# Patient Record
Sex: Male | Born: 1980
Health system: Southern US, Community
[De-identification: ages and names within clinical notes are randomized; demographics above are authoritative.]

## PROBLEM LIST (undated history)

## (undated) DIAGNOSIS — J302 Other seasonal allergic rhinitis: Secondary | ICD-10-CM

---

## 2008-03-17 ENCOUNTER — Emergency Department (HOSPITAL_COMMUNITY): Admission: EM | Admit: 2008-03-17 | Discharge: 2008-03-17 | Payer: Self-pay | Admitting: Emergency Medicine

## 2009-01-07 ENCOUNTER — Emergency Department (HOSPITAL_COMMUNITY): Admission: EM | Admit: 2009-01-07 | Discharge: 2009-01-07 | Payer: Self-pay | Admitting: Emergency Medicine

## 2010-04-21 ENCOUNTER — Emergency Department (HOSPITAL_BASED_OUTPATIENT_CLINIC_OR_DEPARTMENT_OTHER): Admission: EM | Admit: 2010-04-21 | Discharge: 2010-04-21 | Payer: Self-pay | Admitting: Emergency Medicine
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
## 2011-05-05 LAB — DIFFERENTIAL
Basophils Absolute: 0
Lymphocytes Relative: 40
Monocytes Absolute: 0.6
Neutro Abs: 3.2

## 2011-05-05 LAB — CBC
HCT: 41.5
MCHC: 34.4
MCV: 82
Platelets: 303
RDW: 15.1

## 2011-05-05 LAB — COMPREHENSIVE METABOLIC PANEL
Albumin: 4
BUN: 10
Calcium: 9.3
Chloride: 108
Creatinine, Ser: 1.2
Total Bilirubin: 1
Total Protein: 7.4

## 2011-11-23 ENCOUNTER — Encounter (HOSPITAL_BASED_OUTPATIENT_CLINIC_OR_DEPARTMENT_OTHER): Payer: Self-pay | Admitting: *Deleted

## 2011-11-23 ENCOUNTER — Emergency Department (HOSPITAL_BASED_OUTPATIENT_CLINIC_OR_DEPARTMENT_OTHER)
Admission: EM | Admit: 2011-11-23 | Discharge: 2011-11-23 | Disposition: A | Discharge status: Home / Self Care | Payer: Self-pay | Attending: Emergency Medicine | Admitting: Emergency Medicine

## 2011-11-23 DIAGNOSIS — L0291 Cutaneous abscess, unspecified: Secondary | ICD-10-CM

## 2011-11-23 DIAGNOSIS — I10 Essential (primary) hypertension: Secondary | ICD-10-CM | POA: Insufficient documentation

## 2011-11-23 DIAGNOSIS — L02219 Cutaneous abscess of trunk, unspecified: Secondary | ICD-10-CM | POA: Insufficient documentation

## 2011-11-23 DIAGNOSIS — F17200 Nicotine dependence, unspecified, uncomplicated: Secondary | ICD-10-CM | POA: Insufficient documentation

## 2011-11-23 HISTORY — DX: Other seasonal allergic rhinitis: J30.2

## 2011-11-23 MED ORDER — SULFAMETHOXAZOLE-TRIMETHOPRIM 800-160 MG PO TABS: 1.0000 | ORAL_TABLET | Freq: Two times a day (BID) | ORAL | Status: AC

## 2011-11-23 MED ORDER — OXYCODONE-ACETAMINOPHEN 5-325 MG PO TABS: 1.0000 | ORAL_TABLET | ORAL | Status: AC | PRN

## 2011-11-23 NOTE — ED Notes (Signed)
C/o right leg abscess near the groin. First noticed it Sunday. Has been using warm compresses without relief. ?fever with body aches.

## 2011-11-23 NOTE — Discharge Instructions (Signed)
Abscess An abscess (boil or furuncle) is an infected area that contains a collection of pus.  SYMPTOMS Signs and symptoms of an abscess include pain, tenderness, redness, or hardness. You may feel a moveable soft area under your skin. An abscess can occur anywhere in the body.  TREATMENT  A surgical cut (incision) may be made over your abscess to drain the pus. Gauze may be packed into the space or a drain may be looped through the abscess cavity (pocket). This provides a drain that will allow the cavity to heal from the inside outwards. The abscess may be painful for a few days, but should feel much better if it was drained.  Your abscess, if seen early, may not have localized and may not have been drained. If not, another appointment may be required if it does not get better on its own or with medications. HOME CARE INSTRUCTIONS   Only take over-the-counter or prescription medicines for pain, discomfort, or fever as directed by your caregiver.   Take your antibiotics as directed if they were prescribed. Finish them even if you start to feel better.   Keep the skin and clothes clean around your abscess.   If the abscess was drained, you will need to use gauze dressing to collect any draining pus. Dressings will typically need to be changed 3 or more times a day.   The infection may spread by skin contact with others. Avoid skin contact as much as possible.   Practice good hygiene. This includes regular hand washing, cover any draining skin lesions, and do not share personal care items.   If you participate in sports, do not share athletic equipment, towels, whirlpools, or personal care items. Shower after every practice or tournament.   If a draining area cannot be adequately covered:   Do not participate in sports.   Children should not participate in day care until the wound has healed or drainage stops.   If your caregiver has given you a follow-up appointment, it is very important  to keep that appointment. Not keeping the appointment could result in a much worse infection, chronic or permanent injury, pain, and disability. If there is any problem keeping the appointment, you must call back to this facility for assistance.  SEEK MEDICAL CARE IF:   You develop increased pain, swelling, redness, drainage, or bleeding in the wound site.   You develop signs of generalized infection including muscle aches, chills, fever, or a general ill feeling.   You have an oral temperature above 102 F (38.9 C).  MAKE SURE YOU:   Understand these instructions.   Will watch your condition.   Will get help right away if you are not doing well or get worse.  Document Released: 05/03/2005 Document Revised: 07/13/2011 Document Reviewed: 02/25/2008 [facility] Patient Information 2012 [place], [place].Arterial Hypertension Arterial hypertension (high blood pressure) is a condition of elevated pressure in your blood vessels. Hypertension over a long period of time is a risk factor for strokes, heart attacks, and heart failure. It is also the leading cause of kidney (renal) failure.  CAUSES   In Adults -- Over 90% of all hypertension has no known cause. This is called essential or primary hypertension. In the other 10% of people with hypertension, the increase in blood pressure is caused by another disorder. This is called secondary hypertension. Important causes of secondary hypertension are:   Heavy alcohol use.   Obstructive sleep apnea.   Hyperaldosterosim (Conn's syndrome).   Steroid use.  Chronic kidney failure.   Hyperparathyroidism.   Medications.   Renal artery stenosis.   Pheochromocytoma.   Cushing's disease.   Coarctation of the aorta.   Scleroderma renal crisis.   Licorice (in excessive amounts).   Drugs (cocaine, methamphetamine).  Your caregiver can explain any items above that apply to you.  In Children -- Secondary hypertension is more common and should  always be considered.   Pregnancy -- Few women of childbearing age have high blood pressure. However, up to 10% of them develop hypertension of pregnancy. Generally, this will not harm the woman. It may be a sign of 3 complications of pregnancy: preeclampsia, HELLP syndrome, and eclampsia. Follow up and control with medication is necessary.  SYMPTOMS   This condition normally does not produce any noticeable symptoms. It is usually found during a routine exam.   Malignant hypertension is a late problem of high blood pressure. It may have the following symptoms:   Headaches.   Blurred vision.   End-organ damage (this means your kidneys, heart, lungs, and other organs are being damaged).   Stressful situations can increase the blood pressure. If a person with normal blood pressure has their blood pressure go up while being seen by their caregiver, this is often termed "white coat hypertension." Its importance is not known. It may be related with eventually developing hypertension or complications of hypertension.   Hypertension is often confused with mental tension, stress, and anxiety.  DIAGNOSIS  The diagnosis is made by 3 separate blood pressure measurements. They are taken at least 1 week apart from each other. If there is organ damage from hypertension, the diagnosis may be made without repeat measurements. Hypertension is usually identified by having blood pressure readings:  Above 140/90 mmHg measured in both arms, at 3 separate times, over a couple weeks.   Over 130/80 mmHg should be considered a risk factor and may require treatment in patients with diabetes.  Blood pressure readings over 120/80 mmHg are called "pre-hypertension" even in non-diabetic patients. To get a true blood pressure measurement, use the following guidelines. Be aware of the factors that can alter blood pressure readings.  Take measurements at least 1 hour after caffeine.   Take measurements 30 minutes after  smoking and without any stress. This is another reason to quit smoking - it raises your blood pressure.   Use a proper cuff size. Ask your caregiver if you are not sure about your cuff size.   Most home blood pressure cuffs are automatic. They will measure systolic and diastolic pressures. The systolic pressure is the pressure reading at the start of sounds. Diastolic pressure is the pressure at which the sounds disappear. If you are elderly, measure pressures in multiple postures. Try sitting, lying or standing.   Sit at rest for a minimum of 5 minutes before taking measurements.   You should not be on any medications like decongestants. These are found in many cold medications.   Record your blood pressure readings and review them with your caregiver.  If you have hypertension:  Your caregiver may do tests to be sure you do not have secondary hypertension (see "causes" above).   Your caregiver may also look for signs of metabolic syndrome. This is also called Syndrome X or Insulin Resistance Syndrome. You may have this syndrome if you have type 2 diabetes, abdominal obesity, and abnormal blood lipids in addition to hypertension.   Your caregiver will take your medical and family history and perform a physical  exam.   Diagnostic tests may include blood tests (for glucose, cholesterol, potassium, and kidney function), a urinalysis, or an EKG. Other tests may also be necessary depending on your condition.  PREVENTION  There are important lifestyle issues that you can adopt to reduce your chance of developing hypertension:  Maintain a normal weight.   Limit the amount of salt (sodium) in your diet.   Exercise often.   Limit alcohol intake.   Get enough potassium in your diet. Discuss specific advice with your caregiver.   Follow a DASH diet (dietary approaches to stop hypertension). This diet is rich in fruits, vegetables, and low-fat dairy products, and avoids certain fats.    PROGNOSIS  Essential hypertension cannot be cured. Lifestyle changes and medical treatment can lower blood pressure and reduce complications. The prognosis of secondary hypertension depends on the underlying cause. Many people whose hypertension is controlled with medicine or lifestyle changes can live a normal, healthy life.  RISKS AND COMPLICATIONS  While high blood pressure alone is not an illness, it often requires treatment due to its short- and long-term effects on many organs. Hypertension increases your risk for:  CVAs or strokes (cerebrovascular accident).   Heart failure due to chronically high blood pressure (hypertensive cardiomyopathy).   Heart attack (myocardial infarction).   Damage to the retina (hypertensive retinopathy).   Kidney failure (hypertensive nephropathy).  Your caregiver can explain list items above that apply to you. Treatment of hypertension can significantly reduce the risk of complications. TREATMENT   For overweight patients, weight loss and regular exercise are recommended. Physical fitness lowers blood pressure.   Mild hypertension is usually treated with diet and exercise. A diet rich in fruits and vegetables, fat-free dairy products, and foods low in fat and salt (sodium) can help lower blood pressure. Decreasing salt intake decreases blood pressure in a 1/3 of people.   Stop smoking if you are a smoker.  The steps above are highly effective in reducing blood pressure. While these actions are easy to suggest, they are difficult to achieve. Most patients with moderate or severe hypertension end up requiring medications to bring their blood pressure down to a normal level. There are several classes of medications for treatment. Blood pressure pills (antihypertensives) will lower blood pressure by their different actions. Lowering the blood pressure by 10 mmHg may decrease the risk of complications by as much as 25%. The goal of treatment is effective blood  pressure control. This will reduce your risk for complications. Your caregiver will help you determine the best treatment for you according to your lifestyle. What is excellent treatment for one person, may not be for you. HOME CARE INSTRUCTIONS   Do not smoke.   Follow the lifestyle changes outlined in the "Prevention" section.   If you are on medications, follow the directions carefully. Blood pressure medications must be taken as prescribed. Skipping doses reduces their benefit. It also puts you at risk for problems.   Follow up with your caregiver, as directed.   If you are asked to monitor your blood pressure at home, follow the guidelines in the "Diagnosis" section above.  SEEK MEDICAL CARE IF:   You think you are having medication side effects.   You have recurrent headaches or lightheadedness.   You have swelling in your ankles.   You have trouble with your vision.  SEEK IMMEDIATE MEDICAL CARE IF:   You have sudden onset of chest pain or pressure, difficulty breathing, or other symptoms of a heart  attack.   You have a severe headache.   You have symptoms of a stroke (such as sudden weakness, difficulty speaking, difficulty walking).  MAKE SURE YOU:   Understand these instructions.   Will watch your condition.   Will get help right away if you are not doing well or get worse.  Document Released: 07/24/2005 Document Revised: 07/13/2011 Document Reviewed: 02/21/2007 [facility] Patient Information 2012 [place], [place].

## 2011-11-23 NOTE — ED Notes (Signed)
Abscess began draining once the EDP entered room. seroussangenous drainage. Large amt. Dressing applied.

## 2011-11-23 NOTE — ED Provider Notes (Signed)
History     CSN: [id number]  Arrival date & time 11/23/11  0605   First MD Initiated Contact with Patient 11/23/11 [phone]      Chief Complaint  Patient presents with  . Abscess     Patient is a 31 y.o. male presenting with abscess. The history is provided by the patient.  Abscess  This is a new problem. The onset was gradual. The problem occurs continuously. The problem has been gradually worsening. Affected Location: right groin. The problem is moderate. The abscess is characterized by redness. Pertinent negatives include no fever and no vomiting.  pt presents with abscess to right groin for past several days No fever/vomiting He reports having this in the past but it usually "goes away" Denies h/o Diabetes He has no other complaints  Past Medical History  Diagnosis Date  . Seasonal allergies     History reviewed. No pertinent past surgical history.  No family history on file.  History  Substance Use Topics  . Smoking status: Current Everyday Smoker  . Smokeless tobacco: Not on file  . Alcohol Use: Yes      Review of Systems  Constitutional: Negative for fever.  Gastrointestinal: Negative for vomiting.    Allergies  Review of patient's allergies indicates no known allergies.  Home Medications   Current Outpatient Rx  Name Route Sig Dispense Refill  . OXYCODONE-ACETAMINOPHEN 5-325 MG PO TABS Oral Take 1 tablet by mouth every 4 (four) hours as needed for pain. 15 tablet 0  . SULFAMETHOXAZOLE-TRIMETHOPRIM 800-160 MG PO TABS Oral Take 1 tablet by mouth every 12 (twelve) hours. 10 tablet 0    BP 161/95  Pulse 89  Temp(Src) 99 F (37.2 C) (Oral)  Resp 18  Ht 5\' 8"  (1.727 m)  Wt 241 lb (109.317 kg)  BMI 36.64 kg/m2  SpO2 100%  Physical Exam CONSTITUTIONAL: Well developed/well nourished HEAD AND FACE: Normocephalic/atraumatic EYES: EOMI ENMT: Mucous membranes moist NECK: supple no meningeal signs CV: S1/S2 noted, no murmurs/rubs/gallops noted LUNGS:  Lungs are clear to auscultation bilaterally, no apparent distress ABDOMEN: soft, nontender, no rebound or guarding AO:ZHYQM/VHQIONG nontender/normal appearance, no scrotal tenderness/erythema noted He has small abscess to right inguinal fold that is actively draining pus.  No crepitance.  Overlying erythema noted.  Abscess noted extend into scrotum or rectum NEURO: Pt is awake/alert, moves all extremitiesx4 EXTREMITIES:  full ROM SKIN: warm, color normal PSYCH: no abnormalities of mood noted  ED Course  Procedures     1. Abscess   2. Hypertension       MDM  Nursing notes reviewed and considered in documentation   Abscess already draining will start short course of abx         Joya Gaskins, MD 11/23/11 [phone]

## 2013-06-05 ENCOUNTER — Encounter (HOSPITAL_BASED_OUTPATIENT_CLINIC_OR_DEPARTMENT_OTHER): Payer: Self-pay | Admitting: Emergency Medicine

## 2013-06-05 ENCOUNTER — Emergency Department (HOSPITAL_BASED_OUTPATIENT_CLINIC_OR_DEPARTMENT_OTHER)
Admission: EM | Admit: 2013-06-05 | Discharge: 2013-06-05 | Disposition: A | Discharge status: Home / Self Care | Payer: Self-pay | Attending: Emergency Medicine | Admitting: Emergency Medicine

## 2013-06-05 DIAGNOSIS — L0291 Cutaneous abscess, unspecified: Secondary | ICD-10-CM

## 2013-06-05 DIAGNOSIS — F17200 Nicotine dependence, unspecified, uncomplicated: Secondary | ICD-10-CM | POA: Insufficient documentation

## 2013-06-05 DIAGNOSIS — L02219 Cutaneous abscess of trunk, unspecified: Secondary | ICD-10-CM | POA: Insufficient documentation

## 2013-06-05 MED ORDER — HYDROCODONE-ACETAMINOPHEN 5-325 MG PO TABS: 1.0000 | ORAL_TABLET | ORAL | Status: DC | PRN

## 2013-06-05 MED ORDER — SULFAMETHOXAZOLE-TRIMETHOPRIM 800-160 MG PO TABS: 2.0000 | ORAL_TABLET | Freq: Two times a day (BID) | ORAL | Status: DC

## 2013-06-05 NOTE — ED Provider Notes (Signed)
CSN: [id number]     Arrival date & time 06/05/13  1310 History   First MD Initiated Contact with Patient 06/05/13 1343     Chief Complaint  Patient presents with  . Abscess   (Consider location/radiation/quality/duration/timing/severity/associated sxs/prior Treatment) HPI Comments: Pt states that he has had an area to his right groin for a year:pt states that the area swells and drains and then returns:pt has not been seen by a specialist yet:pt states that it has started to hurt again in the last couple of days:denies dysuria  The history is provided by the patient. No language interpreter was used.    Past Medical History  Diagnosis Date  . Seasonal allergies    History reviewed. No pertinent past surgical history. No family history on file. History  Substance Use Topics  . Smoking status: Current Every Day Smoker  . Smokeless tobacco: Not on file  . Alcohol Use: Yes    Review of Systems  Constitutional: Negative.   Respiratory: Negative.   Cardiovascular: Negative.     Allergies  Review of patient's allergies indicates no known allergies.  Home Medications   Current Outpatient Rx  Name  Route  Sig  Dispense  Refill  . HYDROcodone-acetaminophen (NORCO/VICODIN) 5-325 MG per tablet   Oral   Take 1 tablet by mouth every 4 (four) hours as needed for pain.   10 tablet   0   . sulfamethoxazole-trimethoprim (SEPTRA DS) 800-160 MG per tablet   Oral   Take 2 tablets by mouth 2 (two) times daily.   28 tablet   0    BP 126/77  Pulse 98  Temp(Src) 98.3 F (36.8 C) (Oral)  Resp 16  Ht 5\' 8"  (1.727 m)  Wt 230 lb (104.327 kg)  BMI 34.98 kg/m2  SpO2 98% Physical Exam  Nursing note and vitals reviewed. Constitutional: He is oriented to person, place, and time. He appears well-developed and well-nourished.  Cardiovascular: Normal rate and regular rhythm.   Pulmonary/Chest: Effort normal and breath sounds normal.  Genitourinary:  Pt has small area or fullness to the  right groin/scrotom:no redness noted  Musculoskeletal: Normal range of motion.  Neurological: He is alert and oriented to person, place, and time.  Skin: Skin is warm and dry.  Psychiatric: He has a normal mood and affect.    ED Course  Procedures (including critical care time) Labs Review Labs Reviewed - No data to display Imaging Review No results found.  EKG Interpretation   None       MDM   1. Abscess    Pt placed on antibiotics and referred to urology    Teressa Lower, NP 06/05/13 1451

## 2013-06-05 NOTE — ED Provider Notes (Signed)
Medical screening examination/treatment/procedure(s) were conducted as a shared visit with non-physician practitioner(s) and myself.  I personally evaluated the patient during the encounter.  Chronic induration to R groin with small area of purulence. No fluctuance. No evidence of cellulitis, forneir's gangrene.  EKG Interpretation   None         Glynn Octave, MD 06/05/13 1535

## 2013-06-05 NOTE — ED Notes (Signed)
C/o right groin abscess "for years"-worse x 2-3 days

## 2013-09-15 DIAGNOSIS — R1909 Other intra-abdominal and pelvic swelling, mass and lump: Secondary | ICD-10-CM | POA: Insufficient documentation

## 2015-01-05 ENCOUNTER — Emergency Department (HOSPITAL_BASED_OUTPATIENT_CLINIC_OR_DEPARTMENT_OTHER)
Admission: EM | Admit: 2015-01-05 | Discharge: 2015-01-05 | Disposition: A | Discharge status: Home / Self Care | Payer: Self-pay | Attending: Emergency Medicine | Admitting: Emergency Medicine

## 2015-01-05 ENCOUNTER — Encounter (HOSPITAL_BASED_OUTPATIENT_CLINIC_OR_DEPARTMENT_OTHER): Payer: Self-pay | Admitting: Emergency Medicine

## 2015-01-05 DIAGNOSIS — Z792 Long term (current) use of antibiotics: Secondary | ICD-10-CM | POA: Insufficient documentation

## 2015-01-05 DIAGNOSIS — Y998 Other external cause status: Secondary | ICD-10-CM | POA: Insufficient documentation

## 2015-01-05 DIAGNOSIS — X58XXXA Exposure to other specified factors, initial encounter: Secondary | ICD-10-CM | POA: Insufficient documentation

## 2015-01-05 DIAGNOSIS — H109 Unspecified conjunctivitis: Secondary | ICD-10-CM | POA: Insufficient documentation

## 2015-01-05 DIAGNOSIS — S0502XA Injury of conjunctiva and corneal abrasion without foreign body, left eye, initial encounter: Secondary | ICD-10-CM | POA: Insufficient documentation

## 2015-01-05 DIAGNOSIS — Y9389 Activity, other specified: Secondary | ICD-10-CM | POA: Insufficient documentation

## 2015-01-05 DIAGNOSIS — Z720 Tobacco use: Secondary | ICD-10-CM | POA: Insufficient documentation

## 2015-01-05 DIAGNOSIS — Z79899 Other long term (current) drug therapy: Secondary | ICD-10-CM | POA: Insufficient documentation

## 2015-01-05 DIAGNOSIS — Y9289 Other specified places as the place of occurrence of the external cause: Secondary | ICD-10-CM | POA: Insufficient documentation

## 2015-01-05 DIAGNOSIS — Z8709 Personal history of other diseases of the respiratory system: Secondary | ICD-10-CM | POA: Insufficient documentation

## 2015-01-05 MED ORDER — FLUORESCEIN SODIUM 1 MG OP STRP
1.0000 | ORAL_STRIP | Freq: Once | OPHTHALMIC | Status: AC
  Administered 2015-01-05: 2 via OPHTHALMIC
  Filled 2015-01-05: qty 1

## 2015-01-05 MED ORDER — TETRACAINE HCL 0.5 % OP SOLN
2.0000 [drp] | Freq: Once | OPHTHALMIC | Status: AC
  Administered 2015-01-05: 2 [drp] via OPHTHALMIC
  Filled 2015-01-05: qty 2

## 2015-01-05 MED ORDER — TRAMADOL HCL 50 MG PO TABS: 50.0000 mg | ORAL_TABLET | Freq: Four times a day (QID) | ORAL | Status: DC | PRN

## 2015-01-05 MED ORDER — IBUPROFEN 600 MG PO TABS: 600.0000 mg | ORAL_TABLET | Freq: Four times a day (QID) | ORAL | Status: DC | PRN

## 2015-01-05 MED ORDER — GENTAMICIN SULFATE 0.3 % OP SOLN: 1.0000 [drp] | OPHTHALMIC | Status: DC

## 2015-01-05 NOTE — ED Notes (Signed)
PA at bedside.

## 2015-01-05 NOTE — ED Notes (Signed)
Pt in c/o eye pain and redness, possible pink eye. Was seen at [facility] today for same and was not given prescriptions per pt.

## 2015-01-05 NOTE — ED Notes (Signed)
Pt called in lobby; no answer

## 2015-01-05 NOTE — ED Provider Notes (Signed)
CSN: [id number]     Arrival date & time 01/05/15  1649 History   First MD Initiated Contact with Patient 01/05/15 1831     Chief Complaint  Patient presents with  . Eye Pain     (Consider location/radiation/quality/duration/timing/severity/associated sxs/prior Treatment) HPI  Pt is a 34yo male with hx of seasonal allergies, presenting to ED with c/o sudden onset bilateral eye pain and irritation that started today.  Pt states he felt both his eyes itching yesterday "like something was in them" so he rubbed them.  Pt c/o burning stinging pain in both eyes, light makes it worse so he is wearing sunglasses. Denies wearing contacts.  Pt states he was at [facility] just PTA and was prescribed gentamycin ophthalmic drops as well as vicodin but per care everywhere, pt arrived at pharmacy with discharge papers but no prescriptions. Pt states he was advised he needed a tetanus shot but did not receive that and was hurried out of the room before he received the medication.  States he thought the prescription was with his discharge papers. Pt states he did not go back to [facility] as he felt he was rushed out and did not receive proper care.   Past Medical History  Diagnosis Date  . Seasonal allergies    History reviewed. No pertinent past surgical history. History reviewed. No pertinent family history. History  Substance Use Topics  . Smoking status: Current Every Day Smoker  . Smokeless tobacco: Not on file  . Alcohol Use: Yes    Review of Systems  Constitutional: Negative for fever, chills, appetite change and fatigue.  HENT: Negative for congestion, ear pain and sore throat.   Eyes: Positive for photophobia, pain, redness and itching. Negative for discharge and visual disturbance.  Respiratory: Negative for cough and shortness of breath.   Gastrointestinal: Negative for nausea, vomiting, abdominal pain and diarrhea.  Musculoskeletal: Negative for neck pain and neck  stiffness.  Skin: Negative for rash.  Neurological: Negative for dizziness, light-headedness and headaches.  All other systems reviewed and are negative.     Allergies  Review of patient's allergies indicates no known allergies.  Home Medications   Prior to Admission medications   Medication Sig Start Date End Date Taking? Authorizing Provider  gentamicin (GARAMYCIN) 0.3 % ophthalmic solution Place 1 drop into both eyes every 4 (four) hours. For up to 5 days 01/05/15   Junius FinnerErin O'Malley, PA-C  HYDROcodone-acetaminophen (NORCO/VICODIN) 5-325 MG per tablet Take 1 tablet by mouth every 4 (four) hours as needed for pain. 06/05/13   Teressa LowerVrinda Pickering, NP  ibuprofen (ADVIL,MOTRIN) 600 MG tablet Take 1 tablet (600 mg total) by mouth every 6 (six) hours as needed. 01/05/15   Junius FinnerErin O'Malley, PA-C  sulfamethoxazole-trimethoprim (SEPTRA DS) 800-160 MG per tablet Take 2 tablets by mouth 2 (two) times daily. 06/05/13   Teressa LowerVrinda Pickering, NP  traMADol (ULTRAM) 50 MG tablet Take 1 tablet (50 mg total) by mouth every 6 (six) hours as needed. 01/05/15   Junius FinnerErin O'Malley, PA-C   BP 161/99 mmHg  Pulse 83  Temp(Src) 98.2 F (36.8 C) (Oral)  Resp 16  Ht 5\' 7"  (1.702 m)  Wt 214 lb (97.07 kg)  BMI 33.51 kg/m2  SpO2 99% Physical Exam  Constitutional: He is oriented to person, place, and time. He appears well-developed and well-nourished.  Pt wearing sunglasses in room. NAD  HENT:  Head: Normocephalic and atraumatic.  Eyes: EOM are normal. Pupils are equal, round, and reactive to light.  Lids are everted and swept, no foreign bodies found. Right eye exhibits discharge ( clear, watery). Left eye exhibits discharge ( clear, watery). Right conjunctiva is injected. Right conjunctiva has no hemorrhage. Left conjunctiva is injected. Left conjunctiva has no hemorrhage.  Slit lamp exam:      The right eye shows no corneal abrasion, no corneal flare, no corneal ulcer, no foreign body and no fluorescein uptake.       The left  eye shows corneal abrasion and fluorescein uptake. The left eye shows no corneal flare, no corneal ulcer and no foreign body.  Photophobia present.   Neck: Normal range of motion.  Cardiovascular: Normal rate.   Pulmonary/Chest: Effort normal.  Musculoskeletal: Normal range of motion.  Neurological: He is alert and oriented to person, place, and time.  Skin: Skin is warm and dry.  Psychiatric: He has a normal mood and affect. His behavior is normal.  Nursing note and vitals reviewed.   ED Course  Procedures (including critical care time) Labs Review Labs Reviewed - No data to display  Imaging Review No results found.   EKG Interpretation None      MDM   Final diagnoses:  Left corneal abrasion, initial encounter  Bilateral conjunctivitis   Pt presenting to ED with reports of bilateral conjunctivitis. No thick crusty discharge on exam, however, both conjunctiva are injected, photophobia present. Corneal abrasion present on Left eye.  Will tx with gentamicin. Pt had reportedly been prescribed vicodin at [facility], will prescribe tramadol and ibuprofen. Advised to f/u in 3-4 days with PCP. Return precautions provided. Pt verbalized understanding and agreement with tx plan.     Junius Finner, PA-C 01/06/15 1505  Nelva Nay, MD 01/11/15 2156

## 2016-05-15 ENCOUNTER — Emergency Department (HOSPITAL_BASED_OUTPATIENT_CLINIC_OR_DEPARTMENT_OTHER)
Admission: EM | Admit: 2016-05-15 | Discharge: 2016-05-15 | Disposition: A | Discharge status: Home / Self Care | Payer: Self-pay | Attending: Emergency Medicine | Admitting: Emergency Medicine

## 2016-05-15 ENCOUNTER — Encounter (HOSPITAL_BASED_OUTPATIENT_CLINIC_OR_DEPARTMENT_OTHER): Payer: Self-pay | Admitting: *Deleted

## 2016-05-15 DIAGNOSIS — J029 Acute pharyngitis, unspecified: Secondary | ICD-10-CM | POA: Insufficient documentation

## 2016-05-15 DIAGNOSIS — F17200 Nicotine dependence, unspecified, uncomplicated: Secondary | ICD-10-CM | POA: Insufficient documentation

## 2016-05-15 LAB — RAPID STREP SCREEN (MED CTR MEBANE ONLY): STREPTOCOCCUS, GROUP A SCREEN (DIRECT): NEGATIVE

## 2016-05-15 MED ORDER — ACETAMINOPHEN 500 MG PO TABS
1000.0000 mg | ORAL_TABLET | Freq: Once | ORAL | Status: AC
  Administered 2016-05-15: 1000 mg via ORAL
  Filled 2016-05-15: qty 2

## 2016-05-15 MED ORDER — DEXAMETHASONE SODIUM PHOSPHATE 10 MG/ML IJ SOLN
10.0000 mg | Freq: Once | INTRAMUSCULAR | Status: AC
  Administered 2016-05-15: 10 mg via INTRAMUSCULAR
  Filled 2016-05-15: qty 1

## 2016-05-15 NOTE — ED Provider Notes (Signed)
Emergency Department Provider Note   I have reviewed the triage vital signs and the nursing notes.   HISTORY  Chief Complaint Cough   HPI Shane Mullen is a 35 y.o. male with PMH of allergies presents to the ED with 2-3 weeks of right sided sore throat, face swelling, and ear discomfort. Symptoms have persisted despite home OTC medications and time. Pain with swallowing but no physical obstruction appreciated by patient. No difficulty breathing. No voice changes. No recorded fever. Occasional chills. Patient with dental pain 3 weeks ago but "took an antibiotic that my sister gave me" and it got better. Took abx x 1 day.    Past Medical History:  Diagnosis Date  . Seasonal allergies     There are no active problems to display for this patient.   History reviewed. No pertinent surgical history.    Allergies Review of patient's allergies indicates no known allergies.  History reviewed. No pertinent family history.  Social History Social History  Substance Use Topics  . Smoking status: Current Every Day Smoker  . Smokeless tobacco: Never Used  . Alcohol use Yes    Review of Systems  Constitutional: No fever/chills Eyes: No visual changes. ENT: Positive sore throat and right neck pain/swelling.  Cardiovascular: Denies chest pain. Respiratory: Denies shortness of breath. Gastrointestinal: No abdominal pain.  No nausea, no vomiting.  No diarrhea.  No constipation. Genitourinary: Negative for dysuria. Musculoskeletal: Negative for back pain. Skin: Negative for rash. Neurological: Negative for headaches, focal weakness or numbness.  10-point ROS otherwise negative.  ____________________________________________   PHYSICAL EXAM:  VITAL SIGNS: ED Triage Vitals  Enc Vitals Group     BP 05/15/16 1239 (!) 157/101     Pulse Rate 05/15/16 1239 86     Resp 05/15/16 1239 18     Temp 05/15/16 1239 98.3 F (36.8 C)     Temp Source 05/15/16 1239 Oral     SpO2  05/15/16 1239 99 %     Weight 05/15/16 1239 212 lb (96.2 kg)     Height 05/15/16 1239 5\' 8"  (1.727 m)     Pain Score 05/15/16 1243 7   Constitutional: Alert and oriented. Well appearing and in no acute distress. Eyes: Conjunctivae are normal. PERRL. EOMI. Head: Atraumatic. Ears:  Healthy appearing ear canals and TMs bilaterally Nose: No congestion/rhinnorhea. Mouth/Throat: Mucous membranes are moist.  Oropharynx with right tonsillar hypertrophy and scant exudate. No PTA. Managing oral secretions. No muffled voice.  Neck: No stridor.   Cardiovascular: Normal rate, regular rhythm. Good peripheral circulation. Grossly normal heart sounds.   Respiratory: Normal respiratory effort.  No retractions. Lungs CTAB. Gastrointestinal: Soft and nontender. No distention.  Musculoskeletal: No lower extremity tenderness nor edema. No gross deformities of extremities. Neurologic:  Normal speech and language. No gross focal neurologic deficits are appreciated.  Skin:  Skin is warm, dry and intact. No rash noted.   ____________________________________________   LABS (all labs ordered are listed, but only abnormal results are displayed)  Labs Reviewed  RAPID STREP SCREEN (NOT AT [facility])  CULTURE, GROUP A STREP [facility]   ____________________________________________   PROCEDURES  Procedure(s) performed:   Procedures  None ____________________________________________   INITIAL IMPRESSION / ASSESSMENT AND PLAN / ED COURSE  Pertinent labs & imaging results that were available during my care of the patient were reviewed by me and considered in my medical decision making (see chart for details).  Patient with right neck/throat pain for the last 2 weeks. He has no  evidence of PTA but does have right tonsillar hypertrophy. Afebrile in the ED and non-toxic appearing. Low suspicion for deep space neck infection or epiglottitis. Plan for rapid strep testing and reassess.   02:51 PM Rapid strep is  negative. We will treat with IM Decadron for symptomatic relief. Advised that he continue managing symptoms at home with Tylenol and Motrin as needed. Discussed return precautions in detail with the patient. Plan to discharge at this time.   At this time, I do not feel there is any life-threatening condition present. I have reviewed and discussed all results (EKG, imaging, lab, urine as appropriate), exam findings with patient. I have reviewed nursing notes and appropriate previous records.  I feel the patient is safe to be discharged home without further emergent workup. Discussed usual and customary return precautions. Patient and family (if present) verbalize understanding and are comfortable with this plan.  Patient will follow-up with their primary care provider. If they do not have a primary care provider, information for follow-up has been provided to them. All questions have been answered.  ____________________________________________  FINAL CLINICAL IMPRESSION(S) / ED DIAGNOSES  Final diagnoses:  Sore throat     MEDICATIONS GIVEN DURING THIS VISIT:  Medications  acetaminophen (TYLENOL) tablet 1,000 mg (1,000 mg Oral Given 05/15/16 1311)  dexamethasone (DECADRON) injection 10 mg (10 mg Intramuscular Given 05/15/16 1446)     NEW OUTPATIENT MEDICATIONS STARTED DURING THIS VISIT:  None   Note:  This document was prepared using Dragon voice recognition software and may include unintentional dictation errors.  Alona Bene, MD Emergency Medicine   Maia Plan, MD 05/15/16 [phone]

## 2016-05-15 NOTE — Discharge Instructions (Signed)
You were seen in the ED today with sore throat. You strep test was negative but will be sent to the lab. You will be called and instructed to start antibiotics if the result shows any strep at that time.   Return to the ED immediately with any sudden worsening pain, difficulty breathing, or difficulty swallowing.

## 2016-05-15 NOTE — ED Triage Notes (Signed)
Pt reports cough, congestion and sore throat x thursday.

## 2016-05-18 LAB — CULTURE, GROUP A STREP (THRC)

## 2016-06-20 DIAGNOSIS — L309 Dermatitis, unspecified: Secondary | ICD-10-CM | POA: Insufficient documentation

## 2021-06-19 ENCOUNTER — Ambulatory Visit (HOSPITAL_COMMUNITY)
Admission: EM | Admit: 2021-06-19 | Discharge: 2021-06-19 | Disposition: A | Discharge status: Home / Self Care | Payer: 59 | Attending: Emergency Medicine | Admitting: Emergency Medicine

## 2021-06-19 ENCOUNTER — Other Ambulatory Visit: Payer: Self-pay

## 2021-06-19 ENCOUNTER — Encounter (HOSPITAL_COMMUNITY): Payer: Self-pay | Admitting: Emergency Medicine

## 2021-06-19 DIAGNOSIS — I10 Essential (primary) hypertension: Secondary | ICD-10-CM | POA: Insufficient documentation

## 2021-06-19 DIAGNOSIS — R42 Dizziness and giddiness: Secondary | ICD-10-CM | POA: Insufficient documentation

## 2021-06-19 LAB — BASIC METABOLIC PANEL
Anion gap: 7 (ref 5–15)
BUN: 8 mg/dL (ref 6–20)
CO2: 23 mmol/L (ref 22–32)
Calcium: 8.5 mg/dL — ABNORMAL LOW (ref 8.9–10.3)
Chloride: 107 mmol/L (ref 98–111)
Creatinine, Ser: 0.86 mg/dL (ref 0.61–1.24)
GFR, Estimated: >60 mL/min (ref >60)
Glucose, Bld: 83 mg/dL (ref 70–99)
Potassium: 3.6 mmol/L (ref 3.5–5.1)
Sodium: 137 mmol/L (ref 135–145)

## 2021-06-19 MED ORDER — AMLODIPINE BESYLATE 5 MG PO TABS: 5.0000 mg | ORAL_TABLET | Freq: Every day | ORAL | 2 refills | Status: AC

## 2021-06-19 MED ORDER — MECLIZINE HCL 12.5 MG PO TABS: 12.5000 mg | ORAL_TABLET | Freq: Three times a day (TID) | ORAL | 0 refills | Status: AC | PRN

## 2021-06-19 NOTE — Discharge Instructions (Addendum)
The cause of your dizziness is unknown it is possible that is related to your elevated blood pressure therefore we will restart you on medications today  Your blood work to check your electrolytes, kidneys, liver patient is pending remaining levels of concerning value you will be notified  Take amlodipine daily around the same time  Check blood pressure daily and Until seen by primary doctor  You may use Antivert as needed up to 3 times a day to help with dizziness  At any point if you begin to have worsening blurred vision, worsening dizziness, shortness of breath, chest pain, please go to nearest emergency department

## 2021-06-19 NOTE — ED Triage Notes (Signed)
Pt reports that woke up this morning and room spinning. Reports eyes are sensitive to light.  Woke up at 530am had some sausage sandwich and drank mt dew then went and laid back down then at 1120am woke up with these symptoms.

## 2021-06-19 NOTE — ED Provider Notes (Signed)
[place]    CSN: [id number] Arrival date & time: 06/19/21  1153      History   Chief Complaint Chief Complaint  Patient presents with   Dizziness    HPI Shane Mullen is a 40 y.o. male.   Patient present with dizziness and intermittent blurred vision worsened when looking upwards beginning this morning. Felt "woozy".  Endorses that he ate sausage sandwich and drank [place] dew for breakfast then laid down. Symptoms started abruptly upon awakening.  Has never happened before.  Denies chest pain or tightness, shortness of breath, wheezing, abdominal pain, vomiting, headache, lightheadedness, syncope.  Diagnosed with hypertension in 2017, stopped blood pressure medications because he did not like the way they made him feel.  Does not have PCP.     Past Medical History:  Diagnosis Date   Seasonal allergies     There are no problems to display for this patient.   History reviewed. No pertinent surgical history.     Home Medications    Prior to Admission medications   Not on File    Family History No family history on file.  Social History Social History   Tobacco Use   Smoking status: Every Day   Smokeless tobacco: Never  Substance Use Topics   Alcohol use: Yes   Drug use: Yes    Types: Marijuana     Allergies   Patient has no known allergies.   Review of Systems Review of Systems  Constitutional: Negative.   HENT: Negative.    Eyes:  Positive for photophobia and visual disturbance. Negative for pain, discharge, redness and itching.  Respiratory: Negative.    Cardiovascular: Negative.   Gastrointestinal: Negative.   Skin: Negative.   Neurological:  Positive for dizziness. Negative for tremors, seizures, syncope, facial asymmetry, speech difficulty, weakness, light-headedness, numbness and headaches.    Physical Exam Triage Vital Signs ED Triage Vitals  Enc Vitals Group     BP 06/19/21 1331 (!) 169/107     Pulse Rate 06/19/21  1331 71     Resp 06/19/21 1331 18     Temp 06/19/21 1331 98.7 F (37.1 C)     Temp Source 06/19/21 1331 Oral     SpO2 06/19/21 1331 97 %     Weight --      Height --      Head Circumference --      Peak Flow --      Pain Score 06/19/21 1329 0     Pain Loc --      Pain Edu? --      Excl. in [place]? --    No data found.  Updated Vital Signs BP (!) 169/107 (BP Location: Left Arm)   Pulse 71   Temp 98.7 F (37.1 C) (Oral)   Resp 18   SpO2 97%   Visual Acuity Right Eye Distance:   Left Eye Distance:   Bilateral Distance:    Right Eye Near:   Left Eye Near:    Bilateral Near:     Physical Exam Constitutional:      Appearance: Normal appearance. He is normal weight.  HENT:     Right Ear: Tympanic membrane, ear canal and external ear normal.     Left Ear: Tympanic membrane, ear canal and external ear normal.  Cardiovascular:     Rate and Rhythm: Normal rate and regular rhythm.     Pulses: Normal pulses.     Heart sounds: Normal heart sounds.  Pulmonary:  Effort: Pulmonary effort is normal.     Breath sounds: Normal breath sounds.  Skin:    General: Skin is warm and dry.  Neurological:     General: No focal deficit present.     Mental Status: He is alert and oriented to person, place, and time. Mental status is at baseline.  Psychiatric:        Mood and Affect: Mood normal.        Behavior: Behavior normal.     UC Treatments / Results  Labs (all labs ordered are listed, but only abnormal results are displayed) Labs Reviewed - No data to display  EKG   Radiology No results found.  Procedures Procedures (including critical care time)  Medications Ordered in UC Medications - No data to display  Initial Impression / Assessment and Plan / UC Course  I have reviewed the triage vital signs and the nursing notes.  Pertinent labs & imaging results that were available during my care of the patient were reviewed by me and considered in my medical decision  making (see chart for details).  Elevated blood pressure reading with diagnosis of hypertension  Blood pressure elevated in triage, patient unsure if this is his baseline, known diagnosis of hypertension without medication use, exam is unremarkable, neurologically intact, symptoms are not consistent with vertigo, will obtain baseline lab work and start blood pressure medication, PCP referral placed, recommended measuring and recording blood pressure daily until seen by primary doctor, patient given strict precautions for signs of hypertension or urgency and stroke to go to nearest emergency department for further evaluation  1.  Amlodipine 5 mg daily 2.  Meclizine 12.5 mg 3 times daily as needed Final Clinical Impressions(s) / UC Diagnoses   Final diagnoses:  None   Discharge Instructions   None    ED Prescriptions   None    PDMP not reviewed this encounter.   Valinda Hoar, [place] 06/23/21 [phone]

## 2021-12-26 ENCOUNTER — Encounter (HOSPITAL_COMMUNITY): Payer: Self-pay

## 2021-12-26 ENCOUNTER — Ambulatory Visit (HOSPITAL_COMMUNITY)
Admission: EM | Admit: 2021-12-26 | Discharge: 2021-12-26 | Disposition: A | Discharge status: Home / Self Care | Payer: Self-pay | Attending: Internal Medicine | Admitting: Internal Medicine

## 2021-12-26 DIAGNOSIS — L02214 Cutaneous abscess of groin: Secondary | ICD-10-CM

## 2021-12-26 DIAGNOSIS — B20 Human immunodeficiency virus [HIV] disease: Secondary | ICD-10-CM | POA: Insufficient documentation

## 2021-12-26 MED ORDER — SULFAMETHOXAZOLE-TRIMETHOPRIM 800-160 MG PO TABS
1.0000 | ORAL_TABLET | Freq: Two times a day (BID) | ORAL | 0 refills | Status: AC
Start: 2021-12-26 — End: 2022-01-02

## 2021-12-26 MED ORDER — IBUPROFEN 800 MG PO TABS
800.0000 mg | ORAL_TABLET | Freq: Three times a day (TID) | ORAL | 0 refills | Status: AC | PRN
Start: 2021-12-26 — End: ?

## 2021-12-26 NOTE — ED Triage Notes (Signed)
Pt reports cyst in right inner thigh since Christmas. He reports some drainage with odor.

## 2021-12-26 NOTE — Discharge Instructions (Addendum)
Please continue warm compresses Ibuprofen or Tylenol as needed for pain and/or fever Your abscess is draining hence no need to do incision and drainage If swelling worsens, discharge worsens or pain worsens please return to urgent care to be reevaluated.

## 2021-12-29 NOTE — ED Provider Notes (Signed)
[facility]    CSN: [id number] Arrival date & time: 12/26/21  1932      History   Chief Complaint Chief Complaint  Patient presents with   Cyst   Abscess    HPI Shane Mullen is a 41 y.o. male with a history of HIV not on medication currently comes to the urgent care with painful swelling in the right inner thigh.  Swelling started around Christmas and has been persistent.  Patient says the drainage is recurrent.  The swelling and drainage has been going on in a recurrent fashion since December.  This particular episode started about a week or so ago.  It has been draining over the past few days.  No fever or chills.  No nausea or vomiting. HPI  Past Medical History:  Diagnosis Date   Seasonal allergies     Patient Active Problem List   Diagnosis Date Noted   Human immunodeficiency virus (HIV) infection (HCC) 12/26/2021   Eczema 06/20/2016   Groin swelling 09/15/2013    History reviewed. No pertinent surgical history.     Home Medications    Prior to Admission medications   Medication Sig Start Date End Date Taking? Authorizing Provider  ibuprofen (ADVIL) 800 MG tablet Take 1 tablet (800 mg total) by mouth every 8 (eight) hours as needed. 12/26/21  Yes Pennie Vanblarcom, Britta Mccreedy, MD  sulfamethoxazole-trimethoprim (BACTRIM DS) 800-160 MG tablet Take 1 tablet by mouth 2 (two) times daily for 7 days. 12/26/21 01/02/22 Yes Nguyet Mercer, Britta Mccreedy, MD  amLODipine (NORVASC) 5 MG tablet Take 1 tablet (5 mg total) by mouth daily. 06/19/21   Valinda Hoar, NP  meclizine (ANTIVERT) 12.5 MG tablet Take 1 tablet (12.5 mg total) by mouth 3 (three) times daily as needed for dizziness. 06/19/21   Valinda Hoar, NP    Family History History reviewed. No pertinent family history.  Social History Social History   Tobacco Use   Smoking status: Every Day   Smokeless tobacco: Never  Substance Use Topics   Alcohol use: Yes   Drug use: Yes    Types: Marijuana      Allergies   Patient has no known allergies.   Review of Systems Review of Systems  Cardiovascular: Negative.   Gastrointestinal: Negative.   Genitourinary: Negative.   Skin:  Positive for color change and wound. Negative for pallor and rash.    Physical Exam Triage Vital Signs ED Triage Vitals  Enc Vitals Group     BP 12/26/21 1944 (!) 163/110     Pulse Rate 12/26/21 1944 97     Resp 12/26/21 1944 16     Temp 12/26/21 1944 98.5 F (36.9 C)     Temp Source 12/26/21 1944 Oral     SpO2 12/26/21 1944 97 %     Weight --      Height --      Head Circumference --      Peak Flow --      Pain Score 12/26/21 2048 0     Pain Loc --      Pain Edu? --      Excl. in [place]? --    No data found.  Updated Vital Signs BP (!) 163/110 (BP Location: Left Arm)   Pulse 97   Temp 98.5 F (36.9 C) (Oral)   Resp 16   SpO2 97%   Visual Acuity Right Eye Distance:   Left Eye Distance:   Bilateral Distance:    Right Eye  Near:   Left Eye Near:    Bilateral Near:     Physical Exam Vitals and nursing note reviewed.  Constitutional:      General: He is not in acute distress.    Appearance: He is not ill-appearing or diaphoretic.  Genitourinary:    Comments: Draining abscess in the right groin area.  No significant erythema.  No fluctuance noted.  Induration measures about 5 inches in the longest diameter. Neurological:     Mental Status: He is alert.     UC Treatments / Results  Labs (all labs ordered are listed, but only abnormal results are displayed) Labs Reviewed - No data to display  EKG   Radiology No results found.  Procedures Procedures (including critical care time)  Medications Ordered in UC Medications - No data to display  Initial Impression / Assessment and Plan / UC Course  I have reviewed the triage vital signs and the nursing notes.  Pertinent labs & imaging results that were available during my care of the patient were reviewed by me and  considered in my medical decision making (see chart for details).     1.  Abscess in the right groin: Abscess is currently draining hence no indication for an incision and drainage. Warm compresses recommended Bactrim twice daily for 7 days Ibuprofen as needed for pain Patient is advised to return if swelling, pain and drainage worsens. Final Clinical Impressions(s) / UC Diagnoses   Final diagnoses:  Abscess of groin, right     Discharge Instructions      Please continue warm compresses Ibuprofen or Tylenol as needed for pain and/or fever Your abscess is draining hence no need to do incision and drainage If swelling worsens, discharge worsens or pain worsens please return to urgent care to be reevaluated.   ED Prescriptions     Medication Sig Dispense Auth. Provider   sulfamethoxazole-trimethoprim (BACTRIM DS) 800-160 MG tablet Take 1 tablet by mouth 2 (two) times daily for 7 days. 14 tablet Jaaziel Peatross, Britta Mccreedy, MD   ibuprofen (ADVIL) 800 MG tablet Take 1 tablet (800 mg total) by mouth every 8 (eight) hours as needed. 21 tablet Bryant Lipps, Britta Mccreedy, MD      PDMP not reviewed this encounter.   Merrilee Jansky, MD 12/29/21 1726

## 2022-01-10 ENCOUNTER — Encounter (HOSPITAL_BASED_OUTPATIENT_CLINIC_OR_DEPARTMENT_OTHER): Payer: Self-pay | Admitting: Emergency Medicine

## 2022-01-10 ENCOUNTER — Other Ambulatory Visit: Payer: Self-pay

## 2022-01-10 ENCOUNTER — Emergency Department (HOSPITAL_BASED_OUTPATIENT_CLINIC_OR_DEPARTMENT_OTHER)
Admission: EM | Admit: 2022-01-10 | Discharge: 2022-01-10 | Disposition: A | Discharge status: Home / Self Care | Payer: Self-pay | Attending: Emergency Medicine | Admitting: Emergency Medicine

## 2022-01-10 DIAGNOSIS — L0291 Cutaneous abscess, unspecified: Secondary | ICD-10-CM

## 2022-01-10 DIAGNOSIS — L02214 Cutaneous abscess of groin: Secondary | ICD-10-CM | POA: Insufficient documentation

## 2022-01-10 LAB — URINALYSIS, ROUTINE W REFLEX MICROSCOPIC
Bilirubin Urine: NEGATIVE
Glucose, UA: NEGATIVE mg/dL
Hgb urine dipstick: NEGATIVE
Ketones, ur: NEGATIVE mg/dL
Leukocytes,Ua: NEGATIVE
Nitrite: NEGATIVE
Protein, ur: 100 mg/dL — AB
Specific Gravity, Urine: >=1.03 (ref 1.005–1.030)
pH: 5.5 (ref 5.0–8.0)

## 2022-01-10 LAB — URINALYSIS, MICROSCOPIC (REFLEX)

## 2022-01-10 MED ORDER — DOXYCYCLINE HYCLATE 100 MG PO TABS
100.0000 mg | ORAL_TABLET | Freq: Once | ORAL | Status: AC
  Administered 2022-01-10: 100 mg via ORAL
  Filled 2022-01-10: qty 1

## 2022-01-10 MED ORDER — DOXYCYCLINE HYCLATE 100 MG PO CAPS: 100.0000 mg | ORAL_CAPSULE | Freq: Two times a day (BID) | ORAL | 0 refills | Status: AC

## 2022-01-10 MED ORDER — LIDOCAINE-EPINEPHRINE (PF) 2 %-1:200000 IJ SOLN
10.0000 mL | Freq: Once | INTRAMUSCULAR | Status: AC
  Administered 2022-01-10: 10 mL
  Filled 2022-01-10: qty 20

## 2022-01-10 NOTE — ED Notes (Signed)
Patient verbalizes understanding of discharge instructions. Opportunity for questioning and answers were provided. Armband removed by staff, pt discharged from ED. Ambulated out to lobby and provided with more wound covering supplies, gauze and tape

## 2022-01-10 NOTE — ED Provider Notes (Signed)
Emergency Department Provider Note   I have reviewed the triage vital signs and the nursing notes.   HISTORY  Chief Complaint Abscess   HPI Shane Mullen is a 41 y.o. male presents to the ED with right groin abscess. He has had prior abscess in the past requiring I&D but not in this location. 2-3 weeks prior he had swelling and pain in this location and went to UC. No I&D performed but placed on abx and symptoms improved. Swelling pain hav returned over the last several days. No fever or chills. No pain with BM or urination.     Past Medical History:  Diagnosis Date   Seasonal allergies     Review of Systems  Constitutional: No fever/chills Eyes: No visual changes. ENT: No sore throat. Cardiovascular: Denies chest pain. Respiratory: Denies shortness of breath. Gastrointestinal: No abdominal pain.  No nausea, no vomiting.  No diarrhea.  No constipation. Genitourinary: Negative for dysuria. Musculoskeletal: Negative for back pain. Skin: Positive right groin abscess.  Neurological: Negative for headaches, focal weakness or numbness.   ____________________________________________   PHYSICAL EXAM:  VITAL SIGNS: ED Triage Vitals  Enc Vitals Group     BP 01/10/22 1917 (!) 148/99     Pulse Rate 01/10/22 1917 97     Resp 01/10/22 1917 16     Temp 01/10/22 1917 98.6 F (37 C)     Temp Source 01/10/22 1917 Oral     SpO2 01/10/22 1917 100 %   Constitutional: Alert and oriented. Well appearing and in no acute distress. Eyes: Conjunctivae are normal. Head: Atraumatic. Nose: No congestion/rhinnorhea. Mouth/Throat: Mucous membranes are moist.  Neck: No stridor.   Cardiovascular: Normal rate, regular rhythm. Good peripheral circulation. Grossly normal heart sounds.   Respiratory: Normal respiratory effort.  No retractions. Lungs CTAB. Gastrointestinal: Soft and nontender. No distention.  Musculoskeletal: No lower extremity tenderness nor edema. No gross deformities of  extremities. Neurologic:  Normal speech and language. No gross focal neurologic deficits are appreciated.  Skin:  Skin is warm and dry. 4 cm area of fluctuance to the right inguinal crease with 4 additional cm of induration. No crepitus.   ____________________________________________   LABS (all labs ordered are listed, but only abnormal results are displayed)  Labs Reviewed  URINALYSIS, ROUTINE W REFLEX MICROSCOPIC - Abnormal; Notable for the following components:      Result Value   Protein, ur 100 (*)    All other components within normal limits  URINALYSIS, MICROSCOPIC (REFLEX) - Abnormal; Notable for the following components:   Bacteria, UA RARE (*)    All other components within normal limits   ____________________________________________   PROCEDURES  Procedure(s) performed:   Marland KitchenMarland KitchenIncision and Drainage  Date/Time: 01/11/2022 12:11 PM Performed by: Maia Plan, MD Authorized by: Maia Plan, MD   Consent:    Consent obtained:  Verbal   Consent given by:  Patient   Risks, benefits, and alternatives were discussed: yes     Risks discussed:  Bleeding, damage to other organs, infection, incomplete drainage and pain   Alternatives discussed:  No treatment Universal protocol:    Patient identity confirmed:  Verbally with patient Location:    Type:  Abscess   Size:  4 cm   Location:  Lower extremity   Lower extremity location:  Leg   Leg location:  R upper leg Pre-procedure details:    Skin preparation:  Povidone-iodine Sedation:    Sedation type:  None Anesthesia:    Anesthesia method:  Local infiltration   Local anesthetic:  Lidocaine 2% WITH epi Procedure type:    Complexity:  Complex Procedure details:    Ultrasound guidance: yes     Needle aspiration: no     Incision types:  Single straight   Incision depth:  Subcutaneous   Wound management:  Probed and deloculated   Drainage:  Purulent   Drainage amount:  Copious   Wound treatment:  Wound left  open   Packing materials:  1/4 in iodoform gauze   Amount 1/4" iodoform:  12 cm Post-procedure details:    Procedure completion:  Tolerated well, no immediate complications   ____________________________________________   INITIAL IMPRESSION / ASSESSMENT AND PLAN / ED COURSE  Pertinent labs & imaging results that were available during my care of the patient were reviewed by me and considered in my medical decision making (see chart for details).   This patient is Presenting for Evaluation of abscess, which does require a range of treatment options, and is a complaint that involves a high risk of morbidity and mortality.  The Differential Diagnoses includes abscess, cellulitis, developing sepsis gangrene.   Critical Interventions-    Medications  lidocaine-EPINEPHrine (XYLOCAINE W/EPI) 2 %-1:200000 (PF) injection 10 mL (10 mLs Infiltration Given by Other 01/10/22 2120)  doxycycline (VIBRA-TABS) tablet 100 mg (100 mg Oral Given 01/10/22 2127)    Reassessment after intervention: Feeling much better after I&D.   Clinical Laboratory Tests Ordered, included UA without infection.    Medical Decision Making: Summary:  Patient presents with inguinal abscess. Bedside US confirms superficial area of fluid. I&D performed with packing.  Reevaluation with update and discussion with patient. Will pack and have him return for wound re-evaluation. Looking well.   Disposition: discharge  ____________________________________________  FINAL CLINICAL IMPRESSION(S) / ED DIAGNOSES  Final diagnoses:  Abscess     NEW OUTPATIENT MEDICATIONS STARTED DURING THIS VISIT:  Discharge Medication List as of 01/10/2022  9:23 PM     START taking these medications   Details  doxycycline (VIBRAMYCIN) 100 MG capsule Take 1 capsule (100 mg total) by mouth 2 (two) times daily for 7 days., Starting Tue 01/10/2022, Until Tue 01/17/2022, Normal        Note:  This document was prepared using Dragon voice  recognition software and may include unintentional dictation errors.  Alona Bene, MD, [facility] Emergency Medicine    Olaf Mesa, Arlyss Repress, MD 01/11/22 [phone]

## 2022-01-10 NOTE — ED Triage Notes (Signed)
Pt reports abscess on right groin x 2 weeks. Seen at [facility] for same, was placed on abx with no resolution.

## 2022-01-10 NOTE — Discharge Instructions (Signed)
You were seen in the emergency room today with an abscess in your groin.  We were able to drain this and put some packing material and to help keep this open.  He can remove about 1/2 inch to an inch of packing material per day.  Please return in 3 days for wound recheck.  At that time we can fully remove the packing or decide to repack if necessary.  Please take the antibiotics and follow-up with your primary care doctor.

## 2022-01-10 NOTE — ED Provider Triage Note (Signed)
Emergency Medicine Provider Triage Evaluation Note  Shane Mullen , a 41 y.o. male  was evaluated in triage.  Pt complains of abscess to right side of groin. Patient states he has had this for 1 month, has been seen at [facility] and placed on antibiotics. Unable to tell me name of antibiotics. Patient states he received abx 1 week ago. Patient also endorsing fever, nausea, vomiting, decreased urine stream however he states this is a known problem for him.   Review of Systems  Positive:  Negative:   Physical Exam  BP (!) 148/99 (BP Location: Left Arm)   Pulse 97   Temp 98.6 F (37 C) (Oral)   Resp 16   SpO2 100%  Gen:   Awake, no distress   Resp:  Normal effort  MSK:   Moves extremities without difficulty  Other:  3cm abscess to right inguinal fold  Medical Decision Making  Medically screening exam initiated at 8:23 PM.  Appropriate orders placed.  [name] was informed that the remainder of the evaluation will be completed by another provider, this initial triage assessment does not replace that evaluation, and the importance of remaining in the ED until their evaluation is complete.     Al Decant, PA-C 01/10/22 2024

## 2022-01-13 ENCOUNTER — Encounter (HOSPITAL_BASED_OUTPATIENT_CLINIC_OR_DEPARTMENT_OTHER): Payer: Self-pay | Admitting: Emergency Medicine

## 2022-01-13 ENCOUNTER — Emergency Department (HOSPITAL_BASED_OUTPATIENT_CLINIC_OR_DEPARTMENT_OTHER)
Admission: EM | Admit: 2022-01-13 | Discharge: 2022-01-13 | Disposition: A | Discharge status: Home / Self Care | Payer: Self-pay | Attending: Emergency Medicine | Admitting: Emergency Medicine

## 2022-01-13 ENCOUNTER — Other Ambulatory Visit: Payer: Self-pay

## 2022-01-13 DIAGNOSIS — Z4801 Encounter for change or removal of surgical wound dressing: Secondary | ICD-10-CM | POA: Insufficient documentation

## 2022-01-13 DIAGNOSIS — Z5189 Encounter for other specified aftercare: Secondary | ICD-10-CM

## 2022-01-13 DIAGNOSIS — L02214 Cutaneous abscess of groin: Secondary | ICD-10-CM | POA: Insufficient documentation

## 2022-01-13 NOTE — ED Notes (Signed)
Pt verbalizes understanding of discharge instructions. Opportunity for questioning and answers were provided. Pt discharged from ED to home.   ? ?

## 2022-01-13 NOTE — ED Triage Notes (Signed)
Patient arrived via POV c/o wound check for abscess to groin. Patient seen previously for same. Patient is AO x 4, VS WDL, normal gait.

## 2022-01-13 NOTE — ED Provider Notes (Signed)
  [facility] EMERGENCY DEPARTMENT Provider Note   CSN: [id number] Arrival date & time: 01/13/22  2057     History  Chief Complaint  Patient presents with   Wound Check    Shane Mullen is a 41 y.o. male.  Patient is a 41 year old male who had an I&D of a groin abscess 4 days ago who is returning today for recheck and packing removal.  He reports the bleeding has stopped but he is continued to drain purulent material but overall seems to be improving.  The history is provided by the patient.  Wound Check       Home Medications Prior to Admission medications   Medication Sig Start Date End Date Taking? Authorizing Provider  amLODipine (NORVASC) 5 MG tablet Take 1 tablet (5 mg total) by mouth daily. 06/19/21   White, Elita Boone, NP  doxycycline (VIBRAMYCIN) 100 MG capsule Take 1 capsule (100 mg total) by mouth 2 (two) times daily for 7 days. 01/10/22 01/17/22  Long, Arlyss Repress, MD  ibuprofen (ADVIL) 800 MG tablet Take 1 tablet (800 mg total) by mouth every 8 (eight) hours as needed. 12/26/21   LampteyBritta Mccreedy, MD  meclizine (ANTIVERT) 12.5 MG tablet Take 1 tablet (12.5 mg total) by mouth 3 (three) times daily as needed for dizziness. 06/19/21   Valinda Hoar, NP      Allergies    Patient has no known allergies.    Review of Systems   Review of Systems  Physical Exam Updated Vital Signs BP (!) 136/96 (BP Location: Left Arm)   Pulse 80   Temp (!) 97.2 F (36.2 C)   Resp 18   Ht 5\' 8"  (1.727 m)   Wt 95.3 kg   SpO2 100%   BMI 31.93 kg/m  Physical Exam Cardiovascular:     Rate and Rhythm: Normal rate.  Pulmonary:     Effort: Pulmonary effort is normal.  Genitourinary:   Neurological:     Mental Status: He is alert.     ED Results / Procedures / Treatments   Labs (all labs ordered are listed, but only abnormal results are displayed) Labs Reviewed - No data to display  EKG None  Radiology No results found.  Procedures Procedures     Medications Ordered in ED Medications - No data to display  ED Course/ Medical Decision Making/ A&P                           Medical Decision Making  Patient returning today for abscess recheck.  Packing was removed but patient is still having purulent drainage but overall appears to be improving.  Packing was replaced patient was discharged home with return precautions.  Does not appear to need further I&D.        Final Clinical Impression(s) / ED Diagnoses Final diagnoses:  Wound check, abscess    Rx / DC Orders ED Discharge Orders     None         , MD 01/13/22 2225

## 2022-01-13 NOTE — Discharge Instructions (Signed)
Pull the packing out in approximately 3 days.  You will need to return to the emergency room if the area is starting to look worse

## 2023-11-14 DIAGNOSIS — Z13220 Encounter for screening for lipoid disorders: Secondary | ICD-10-CM | POA: Diagnosis not present

## 2023-11-14 DIAGNOSIS — R21 Rash and other nonspecific skin eruption: Secondary | ICD-10-CM | POA: Diagnosis not present

## 2023-11-14 DIAGNOSIS — Z21 Asymptomatic human immunodeficiency virus [HIV] infection status: Secondary | ICD-10-CM | POA: Diagnosis not present

## 2023-11-14 DIAGNOSIS — I10 Essential (primary) hypertension: Secondary | ICD-10-CM | POA: Diagnosis not present

## 2023-12-24 DIAGNOSIS — B079 Viral wart, unspecified: Secondary | ICD-10-CM | POA: Diagnosis not present

## 2023-12-24 DIAGNOSIS — Z21 Asymptomatic human immunodeficiency virus [HIV] infection status: Secondary | ICD-10-CM | POA: Diagnosis not present

## 2023-12-24 DIAGNOSIS — I10 Essential (primary) hypertension: Secondary | ICD-10-CM | POA: Diagnosis not present

## 2023-12-24 DIAGNOSIS — R300 Dysuria: Secondary | ICD-10-CM | POA: Diagnosis not present

## 2024-03-17 DIAGNOSIS — R300 Dysuria: Secondary | ICD-10-CM | POA: Diagnosis not present

## 2024-03-17 DIAGNOSIS — Z21 Asymptomatic human immunodeficiency virus [HIV] infection status: Secondary | ICD-10-CM | POA: Diagnosis not present

## 2024-03-17 DIAGNOSIS — I10 Essential (primary) hypertension: Secondary | ICD-10-CM | POA: Diagnosis not present

## 2024-05-01 DIAGNOSIS — R8281 Pyuria: Secondary | ICD-10-CM | POA: Diagnosis not present

## 2024-05-01 DIAGNOSIS — B20 Human immunodeficiency virus [HIV] disease: Secondary | ICD-10-CM | POA: Diagnosis not present

## 2024-06-26 DIAGNOSIS — I10 Essential (primary) hypertension: Secondary | ICD-10-CM | POA: Diagnosis not present

## 2024-06-26 DIAGNOSIS — B354 Tinea corporis: Secondary | ICD-10-CM | POA: Diagnosis not present

## 2024-06-26 DIAGNOSIS — B20 Human immunodeficiency virus [HIV] disease: Secondary | ICD-10-CM | POA: Diagnosis not present

## 2024-06-26 DIAGNOSIS — R8281 Pyuria: Secondary | ICD-10-CM | POA: Diagnosis not present
